# Patient Record
Sex: Female | Born: 1955 | Race: Black or African American | Marital: Single | Smoking: Former smoker
Health system: Southern US, Community
[De-identification: ages and names within clinical notes are randomized; demographics above are authoritative.]

## PROBLEM LIST (undated history)

## (undated) DIAGNOSIS — I10 Essential (primary) hypertension: Secondary | ICD-10-CM

## (undated) HISTORY — PX: OTHER SURGICAL HISTORY: SHX169

## (undated) HISTORY — PX: GALLBLADDER SURGERY: SHX652

## (undated) HISTORY — PX: CARPAL TUNNEL RELEASE: SHX101

---

## 2018-08-21 ENCOUNTER — Other Ambulatory Visit: Payer: Self-pay

## 2018-08-21 ENCOUNTER — Emergency Department (HOSPITAL_COMMUNITY)
Admission: EM | Admit: 2018-08-21 | Discharge: 2018-08-21 | Disposition: A | Discharge status: Home / Self Care | Payer: BLUE CROSS/BLUE SHIELD | Attending: Emergency Medicine | Admitting: Emergency Medicine

## 2018-08-21 ENCOUNTER — Emergency Department (HOSPITAL_COMMUNITY): Payer: BLUE CROSS/BLUE SHIELD

## 2018-08-21 ENCOUNTER — Encounter (HOSPITAL_COMMUNITY): Payer: Self-pay

## 2018-08-21 DIAGNOSIS — I10 Essential (primary) hypertension: Secondary | ICD-10-CM | POA: Insufficient documentation

## 2018-08-21 DIAGNOSIS — R112 Nausea with vomiting, unspecified: Secondary | ICD-10-CM | POA: Insufficient documentation

## 2018-08-21 DIAGNOSIS — R42 Dizziness and giddiness: Secondary | ICD-10-CM | POA: Diagnosis not present

## 2018-08-21 DIAGNOSIS — R0981 Nasal congestion: Secondary | ICD-10-CM | POA: Insufficient documentation

## 2018-08-21 DIAGNOSIS — R51 Headache: Secondary | ICD-10-CM | POA: Insufficient documentation

## 2018-08-21 HISTORY — DX: Essential (primary) hypertension: I10

## 2018-08-21 LAB — CBC WITH DIFFERENTIAL/PLATELET
Abs Immature Granulocytes: 0.05 10*3/uL (ref 0.00–0.07)
Basophils Absolute: 0 10*3/uL (ref 0.0–0.1)
Basophils Relative: 0 %
Eosinophils Absolute: 0 10*3/uL (ref 0.0–0.5)
Eosinophils Relative: 0 %
HCT: 37.2 % (ref 36.0–46.0)
Hemoglobin: 11.8 g/dL — ABNORMAL LOW (ref 12.0–15.0)
Immature Granulocytes: 1 %
Lymphocytes Relative: 8 %
Lymphs Abs: 0.8 10*3/uL (ref 0.7–4.0)
MCH: 28.9 pg (ref 26.0–34.0)
MCHC: 31.7 g/dL (ref 30.0–36.0)
MCV: 91 fL (ref 80.0–100.0)
Monocytes Absolute: 0.3 10*3/uL (ref 0.1–1.0)
Monocytes Relative: 3 %
Neutro Abs: 9.1 10*3/uL — ABNORMAL HIGH (ref 1.7–7.7)
Neutrophils Relative %: 88 %
Platelets: 265 10*3/uL (ref 150–400)
RBC: 4.09 MIL/uL (ref 3.87–5.11)
RDW: 13.8 % (ref 11.5–15.5)
WBC: 10.3 10*3/uL (ref 4.0–10.5)
nRBC: 0 % (ref 0.0–0.2)

## 2018-08-21 LAB — BASIC METABOLIC PANEL
Anion gap: 13 (ref 5–15)
BUN: 15 mg/dL (ref 8–23)
CO2: 23 mmol/L (ref 22–32)
Calcium: 9.4 mg/dL (ref 8.9–10.3)
Chloride: 103 mmol/L (ref 98–111)
Creatinine, Ser: 0.81 mg/dL (ref 0.44–1.00)
GFR calc Af Amer: >60 mL/min (ref >60)
GFR calc non Af Amer: >60 mL/min (ref >60)
Glucose, Bld: 123 mg/dL — ABNORMAL HIGH (ref 70–99)
Potassium: 4 mmol/L (ref 3.5–5.1)
Sodium: 139 mmol/L (ref 135–145)

## 2018-08-21 LAB — TROPONIN I: Troponin I: <0.03 ng/mL (ref <0.03)

## 2018-08-21 LAB — MAGNESIUM: Magnesium: 1.7 mg/dL (ref 1.7–2.4)

## 2018-08-21 MED ORDER — SODIUM CHLORIDE 0.9 % IV BOLUS
1000.0000 mL | Freq: Once | INTRAVENOUS | Status: AC
  Administered 2018-08-21: 1000 mL via INTRAVENOUS

## 2018-08-21 MED ORDER — FLUTICASONE PROPIONATE 50 MCG/ACT NA SUSP: 2.0000 | Freq: Every day | NASAL | 0 refills | Status: AC

## 2018-08-21 MED ORDER — MECLIZINE HCL 12.5 MG PO TABS: 12.5000 mg | ORAL_TABLET | Freq: Three times a day (TID) | ORAL | 0 refills | Status: DC | PRN

## 2018-08-21 MED ORDER — LORATADINE 10 MG PO TABS: 10.0000 mg | ORAL_TABLET | Freq: Every day | ORAL | 0 refills | Status: AC

## 2018-08-21 MED ORDER — MECLIZINE HCL 25 MG PO TABS
25.0000 mg | ORAL_TABLET | Freq: Once | ORAL | Status: AC
  Administered 2018-08-21: 25 mg via ORAL
  Filled 2018-08-21: qty 1

## 2018-08-21 MED ORDER — DEXAMETHASONE SODIUM PHOSPHATE 10 MG/ML IJ SOLN
10.0000 mg | Freq: Once | INTRAMUSCULAR | Status: AC
  Administered 2018-08-21: 10 mg via INTRAVENOUS
  Filled 2018-08-21: qty 1

## 2018-08-21 MED ORDER — ONDANSETRON 4 MG PO TBDP
4.0000 mg | ORAL_TABLET | Freq: Once | ORAL | Status: AC
  Administered 2018-08-21: 15:00:00 4 mg via ORAL
  Filled 2018-08-21: qty 1

## 2018-08-21 MED ORDER — LORAZEPAM 2 MG/ML IJ SOLN
1.0000 mg | Freq: Once | INTRAMUSCULAR | Status: AC
  Administered 2018-08-21: 1 mg via INTRAVENOUS
  Filled 2018-08-21: qty 1

## 2018-08-21 NOTE — ED Notes (Signed)
Attempted to ambulate pt. She could not even stand, reported dizziness upon sitting up and said she needed to lie back down.

## 2018-08-21 NOTE — ED Notes (Signed)
ED Provider at bedside. 

## 2018-08-21 NOTE — ED Notes (Signed)
Patient verbalizes understanding of discharge instructions. Opportunity for questioning and answers were provided. Armband removed by staff, pt discharged from ED.  

## 2018-08-21 NOTE — ED Triage Notes (Addendum)
Pt arrives with [facility] c/o nausea and vomitting that started around 12-1 am this morning.  Pt reports vomiting "lots of times" and vomited her chicken she had for dinner. Pt also reports feeling dizzy "like the room is spinning". Pt has hx of HTN but was taken off bp meds by her provider and encouraged exercise and healthy eating per EMS. Pt given Zofran 4 mg en route by EMS. EMS reports pt's\ highest BP en route was 232/128.

## 2018-08-21 NOTE — ED Notes (Signed)
Pt returned to room from MRI.

## 2018-08-21 NOTE — ED Notes (Signed)
Pt ambulated well and had stable gait; denies feeling dizzy and states she "feels better".

## 2018-08-21 NOTE — ED Notes (Signed)
NURSE NAVIGATOR  Spoke to pts daughter French Ana , added her phone # and her sisters phone # to pts emergency contacts, made daughter aware that her mom had a MRI and results are forthcoming

## 2018-08-21 NOTE — ED Provider Notes (Signed)
[facility] EMERGENCY DEPARTMENT Provider Note   CSN: [id number] Arrival date & time: 08/21/18  6967    History   Chief Complaint Chief Complaint  Patient presents with  . n/v  . Hypertension    HPI Selena Ford is a 63 y.o. female.     HPI 63 year old female with past medical history of hypertension here with headache.  The patient states that over the last 12 hours, she has had progressively worsening, positional dizziness.  She states she was in her usual state of health last night, when she experienced a sensation of the room was spinning.  She tried to close her eyes and go to bed, and was able to sleep.  However, upon awakening around 3 AM again, she experienced extreme dizziness.  She felt like she was moving.  She felt like the room is spinning.  She had associated nausea and vomiting.  No overt headache with it.  The symptoms have persisted whenever she moves or sits up.  She was unable to essentially move due to this dizziness.  She denies history of vertigo.  No tinnitus.  She does endorse some sinus pressure and congestion over the last several days.  Denies any history of inner ear problems.  No other medical complaints.  No fevers or chills.  No focal numbness or weakness.  Past Medical History:  Diagnosis Date  . Hypertension     There are no active problems to display for this patient.   Past Surgical History:  Procedure Laterality Date  . c-section    . CARPAL TUNNEL RELEASE    . GALLBLADDER SURGERY       OB History   No obstetric history on file.      Home Medications    Prior to Admission medications   Medication Sig Start Date End Date Taking? Authorizing Provider  fluticasone (FLONASE) 50 MCG/ACT nasal spray Place 2 sprays into both nostrils daily for 14 days. 08/21/18 09/04/18  Shaune Pollack, MD  loratadine (CLARITIN) 10 MG tablet Take 1 tablet (10 mg total) by mouth daily for 14 days. 08/21/18 09/04/18  Shaune Pollack, MD   meclizine (ANTIVERT) 12.5 MG tablet Take 1 tablet (12.5 mg total) by mouth 3 (three) times daily as needed for dizziness. 08/21/18   Shaune Pollack, MD    Family History History reviewed. No pertinent family history.  Social History Social History   Tobacco Use  . Smoking status: Former Games developer  . Smokeless tobacco: Never Used  . Tobacco comment: pt reports "smoking when she younger"  Substance Use Topics  . Alcohol use: Not Currently  . Drug use: Never     Allergies   Aspirin   Review of Systems Review of Systems  Constitutional: Positive for fatigue. Negative for chills and fever.  HENT: Negative for congestion and rhinorrhea.   Eyes: Negative for visual disturbance.  Respiratory: Negative for cough, shortness of breath and wheezing.   Cardiovascular: Negative for chest pain and leg swelling.  Gastrointestinal: Positive for nausea and vomiting. Negative for abdominal pain and diarrhea.  Genitourinary: Negative for dysuria and flank pain.  Musculoskeletal: Negative for neck pain and neck stiffness.  Skin: Negative for rash and wound.  Allergic/Immunologic: Negative for immunocompromised state.  Neurological: Positive for dizziness and weakness. Negative for syncope and headaches.  All other systems reviewed and are negative.    Physical Exam Updated Vital Signs BP (!) 182/84 (BP Location: Right Arm) Comment: Simultaneous filing. User may not have seen previous  data.  Pulse 64 Comment: Simultaneous filing. User may not have seen previous data.  Temp 98.6 F (37 C) (Oral)   Resp 15   Ht 5\' 1"  (1.549 m)   Wt 81.6 kg   SpO2 99% Comment: Simultaneous filing. User may not have seen previous data.  BMI 34.01 kg/m   Physical Exam Vitals signs and nursing note reviewed.  Constitutional:      General: She is not in acute distress.    Appearance: She is well-developed.  HENT:     Head: Normocephalic and atraumatic.     Comments: Moderate nasal congestion. TMs w/  serous effusions bilaterally. Eyes:     Conjunctiva/sclera: Conjunctivae normal.  Neck:     Musculoskeletal: Neck supple.  Cardiovascular:     Rate and Rhythm: Normal rate and regular rhythm.     Heart sounds: Normal heart sounds. No murmur. No friction rub.  Pulmonary:     Effort: Pulmonary effort is normal. No respiratory distress.     Breath sounds: Normal breath sounds. No wheezing or rales.  Abdominal:     General: There is no distension.     Palpations: Abdomen is soft.     Tenderness: There is no abdominal tenderness.  Skin:    General: Skin is warm.     Capillary Refill: Capillary refill takes less than 2 seconds.  Neurological:     Mental Status: She is alert and oriented to person, place, and time.     Motor: No abnormal muscle tone.     Neurological Exam:  Mental Status: Alert and oriented to person, place, and time. Attention and concentration normal. Speech clear. Recent memory is intact. Cranial Nerves: Visual fields grossly intact. EOMI and PERRLA. Left ward nystagmus noted with left gaze deviation. Facial sensation intact at forehead, maxillary cheek, and chin/mandible bilaterally. No facial asymmetry or weakness. Hearing grossly normal. Uvula is midline, and palate elevates symmetrically. Normal SCM and trapezius strength. Tongue midline without fasciculations. Motor: Muscle strength 5/5 in proximal and distal UE and LE bilaterally. No pronator drift. Muscle tone normal. Reflexes: 2+ and symmetrical in all four extremities.  Sensation: Intact to light touch in upper and lower extremities distally bilaterally.  Gait: Normal without ataxia. Coordination: Normal FTN bilaterally.   ED Treatments / Results  Labs (all labs ordered are listed, but only abnormal results are displayed) Labs Reviewed  CBC WITH DIFFERENTIAL/PLATELET - Abnormal; Notable for the following components:      Result Value   Hemoglobin 11.8 (*)    Neutro Abs 9.1 (*)    All other components  within normal limits  BASIC METABOLIC PANEL - Abnormal; Notable for the following components:   Glucose, Bld 123 (*)    All other components within normal limits  TROPONIN I  MAGNESIUM    EKG EKG Interpretation  Date/Time:  Wednesday Aug 21 2018 09:19:25 EDT Ventricular Rate:  72 PR Interval:    QRS Duration: 101 QT Interval:  483 QTC Calculation: 529 R Axis:   10 Text Interpretation:  Sinus rhythm Prominent P waves, nondiagnostic Probable left ventricular hypertrophy Prolonged QT interval No old tracing to compare Confirmed by Shaune PollackIsaacs, Dashanti Burr [phone]([id number]) on 08/21/2018 9:36:11 AM   Radiology Ct Head Wo Contrast  Result Date: 08/21/2018 CLINICAL DATA:  Dizziness. EXAM: CT HEAD WITHOUT CONTRAST TECHNIQUE: Contiguous axial images were obtained from the base of the skull through the vertex without intravenous contrast. COMPARISON:  None. FINDINGS: Brain: No evidence of acute infarction, hemorrhage, hydrocephalus, extra-axial collection or  mass lesion/mass effect. Vascular: No hyperdense vessel or unexpected calcification. Skull: Normal. Negative for fracture or focal lesion. Sinuses/Orbits: No acute finding. Other: None. IMPRESSION: Normal head CT. Electronically Signed   By: Lupita Raider M.D.   On: 08/21/2018 10:23   Mr Brain Wo Contrast  Result Date: 08/21/2018 CLINICAL DATA:  Focal neuro deficit greater than 6 hours EXAM: MRI HEAD WITHOUT CONTRAST TECHNIQUE: Multiplanar, multiecho pulse sequences of the brain and surrounding structures were obtained without intravenous contrast. COMPARISON:  08/21/2018 FINDINGS: Brain: Negative for acute infarct. Multiple small periventricular deep white matter hyperintensities bilaterally. Brainstem and cerebellum intact. Negative for hemorrhage or mass. Vascular: Normal arterial flow voids Skull and upper cervical spine: Negative Sinuses/Orbits: Negative Other: None IMPRESSION: No acute intracranial abnormality. Mild-to-moderate white matter changes most  likely due to chronic microvascular ischemia. Electronically Signed   By: Marlan Palau M.D.   On: 08/21/2018 11:48    Procedures Procedures (including critical care time)  Medications Ordered in ED Medications  LORazepam (ATIVAN) injection 1 mg (1 mg Intravenous Given 08/21/18 1010)  sodium chloride 0.9 % bolus 1,000 mL (0 mLs Intravenous Stopped 08/21/18 1237)  meclizine (ANTIVERT) tablet 25 mg (25 mg Oral Given 08/21/18 1238)  dexamethasone (DECADRON) injection 10 mg (10 mg Intravenous Given 08/21/18 1239)  ondansetron (ZOFRAN-ODT) disintegrating tablet 4 mg (4 mg Oral Given 08/21/18 1444)     Initial Impression / Assessment and Plan / ED Course  I have reviewed the triage vital signs and the nursing notes.  Pertinent labs & imaging results that were available during my care of the patient were reviewed by me and considered in my medical decision making (see chart for details).  Clinical Course as of Aug 21 1727  Wed Aug 21, 2018  1340 63 yo F here with acute vertigo, worse when moving head and looking left. I suspect this is 2/2 peripheral etiology, but given her age, MRI obtained and is fortunately neg (CT w/o bleed as well given her HTN). Labs are reassuring - mild anemia unlikely to cause sx, trop neg, lytes wnl. EKG non-ischemic. Ativan, meclizine given w/ improvement. I suspect her vertigo is 2/2 middle/inner ear effusions related to her allergic sinusitis - will start on flonase, anithistamines, dose of decadron given here.   [CI]    Clinical Course User Index [CI] Shaune Pollack, MD       Pt feeling better with meds as above. Ambulatory now w/o difficulty. She does continue to have some nausea with head movements but is improving. Instructed on Epley maneuvers.  Final Clinical Impressions(s) / ED Diagnoses   Final diagnoses:  Vertigo    ED Discharge Orders         Ordered    fluticasone (FLONASE) 50 MCG/ACT nasal spray  Daily     08/21/18 1409    meclizine  (ANTIVERT) 12.5 MG tablet  3 times daily PRN     08/21/18 1409    loratadine (CLARITIN) 10 MG tablet  Daily     08/21/18 1409           Shaune Pollack, MD 08/21/18 1729

## 2018-08-21 NOTE — ED Notes (Signed)
Patient transported to CT 

## 2018-08-21 NOTE — Discharge Instructions (Signed)
DO NOT DRIVE until you are symptom free for at least 24 hours  Follow-up with an ENT physician. If you do not have one, you can call the number above or request a referral from your PCP.  Take an over-the-counter antihistamine like Loratadine or Fexofenadine for your allergies, in addition to flonase or other steroid nasal spray. Take this DAILY for 10-14 days to help drainage.  Take the meclizine as needed for vertigo. I'd recommend taking it at least twice a day tomorrow then as needed.

## 2018-08-21 NOTE — ED Notes (Signed)
Spoke to pt's daughter, Malani Cresswell at [phone] and gave update on her mother; informed her that her mother was still in MRI and that I would call her with further updates.

## 2018-08-21 NOTE — ED Notes (Signed)
Patient transported to MRI 

## 2018-08-22 ENCOUNTER — Telehealth: Payer: Self-pay | Admitting: *Deleted

## 2018-08-22 NOTE — Telephone Encounter (Signed)
[facility] consulted regarding PCP for parent.  [facility] referred [facility] as they are known to accept [employer]BS.  EDCM advised that if  [facility] is not accepting new patients, to call number on insurance card for list of physicians accepting patients at this time.  N o further EDCM needs identified.

## 2019-04-29 DIAGNOSIS — I10 Essential (primary) hypertension: Secondary | ICD-10-CM | POA: Diagnosis not present

## 2019-04-29 DIAGNOSIS — E213 Hyperparathyroidism, unspecified: Secondary | ICD-10-CM | POA: Diagnosis not present

## 2019-04-29 DIAGNOSIS — D649 Anemia, unspecified: Secondary | ICD-10-CM | POA: Diagnosis not present

## 2019-07-05 ENCOUNTER — Ambulatory Visit: Payer: Medicare Other | Attending: Internal Medicine

## 2019-07-05 DIAGNOSIS — Z23 Encounter for immunization: Secondary | ICD-10-CM

## 2019-07-05 NOTE — Progress Notes (Signed)
   Covid-19 Vaccination Clinic  Name:  Selena Ford    MRN: [id number] DOB: 03/04/1956  07/05/2019  [name] was observed post Covid-19 immunization for 30 minutes based on pre-vaccination screening without incident. She was provided with Vaccine Information Sheet and instruction to access the V-Safe system.   [name] was instructed to call 911 with any severe reactions post vaccine: Marland Kitchen Difficulty breathing  . Swelling of face and throat  . A fast heartbeat  . A bad rash all over body  . Dizziness and weakness   Immunizations Administered    Name Date Dose VIS Date Route   Pfizer COVID-19 Vaccine 07/05/2019 10:15 AM 0.3 mL 03/07/2019 Intramuscular   Manufacturer: [employer], [employer]   Lot: [id number]   NDC: [address]-6

## 2019-07-28 ENCOUNTER — Ambulatory Visit: Payer: Medicare Other | Attending: Internal Medicine

## 2019-07-28 DIAGNOSIS — Z23 Encounter for immunization: Secondary | ICD-10-CM

## 2019-07-28 NOTE — Progress Notes (Signed)
   Covid-19 Vaccination Clinic  Name:  Selena Ford    MRN: [id number] DOB: [birth year]  07/28/2019  [name] was observed post Covid-19 immunization for 15 minutes without incident. She was provided with Vaccine Information Sheet and instruction to access the V-Safe system.   [name] was instructed to call 911 with any severe reactions post vaccine: Marland Kitchen Difficulty breathing  . Swelling of face and throat  . A fast heartbeat  . A bad rash all over body  . Dizziness and weakness   Immunizations Administered    Name Date Dose VIS Date Route   Pfizer COVID-19 Vaccine 07/28/2019 12:57 PM 0.3 mL 05/21/2018 Intramuscular   Manufacturer: [employer], [employer]   Lot: [id number]   NDC: [address]-6

## 2019-07-29 DIAGNOSIS — Z1339 Encounter for screening examination for other mental health and behavioral disorders: Secondary | ICD-10-CM | POA: Diagnosis not present

## 2019-07-29 DIAGNOSIS — G4733 Obstructive sleep apnea (adult) (pediatric): Secondary | ICD-10-CM | POA: Diagnosis not present

## 2019-07-29 DIAGNOSIS — E559 Vitamin D deficiency, unspecified: Secondary | ICD-10-CM | POA: Diagnosis not present

## 2019-07-29 DIAGNOSIS — M25562 Pain in left knee: Secondary | ICD-10-CM | POA: Diagnosis not present

## 2019-07-29 DIAGNOSIS — Z1159 Encounter for screening for other viral diseases: Secondary | ICD-10-CM | POA: Diagnosis not present

## 2019-07-29 DIAGNOSIS — Z03818 Encounter for observation for suspected exposure to other biological agents ruled out: Secondary | ICD-10-CM | POA: Diagnosis not present

## 2019-07-29 DIAGNOSIS — Z0000 Encounter for general adult medical examination without abnormal findings: Secondary | ICD-10-CM | POA: Diagnosis not present

## 2019-07-29 DIAGNOSIS — N183 Chronic kidney disease, stage 3 unspecified: Secondary | ICD-10-CM | POA: Diagnosis not present

## 2019-07-29 DIAGNOSIS — I10 Essential (primary) hypertension: Secondary | ICD-10-CM | POA: Diagnosis not present

## 2019-08-06 DIAGNOSIS — Z1211 Encounter for screening for malignant neoplasm of colon: Secondary | ICD-10-CM | POA: Diagnosis not present

## 2019-08-06 DIAGNOSIS — Z1212 Encounter for screening for malignant neoplasm of rectum: Secondary | ICD-10-CM | POA: Diagnosis not present

## 2019-08-12 DIAGNOSIS — E213 Hyperparathyroidism, unspecified: Secondary | ICD-10-CM | POA: Diagnosis not present

## 2019-08-12 DIAGNOSIS — D649 Anemia, unspecified: Secondary | ICD-10-CM | POA: Diagnosis not present

## 2019-08-18 ENCOUNTER — Other Ambulatory Visit: Payer: Self-pay | Admitting: Adult Medicine

## 2019-08-18 DIAGNOSIS — R5381 Other malaise: Secondary | ICD-10-CM

## 2019-08-18 DIAGNOSIS — Z1231 Encounter for screening mammogram for malignant neoplasm of breast: Secondary | ICD-10-CM

## 2019-10-23 DIAGNOSIS — Z20822 Contact with and (suspected) exposure to covid-19: Secondary | ICD-10-CM | POA: Diagnosis not present

## 2019-11-17 ENCOUNTER — Other Ambulatory Visit: Payer: Self-pay | Admitting: Adult Medicine

## 2019-11-17 DIAGNOSIS — Z13820 Encounter for screening for osteoporosis: Secondary | ICD-10-CM

## 2019-11-18 ENCOUNTER — Ambulatory Visit
Admission: RE | Admit: 2019-11-18 | Discharge: 2019-11-18 | Disposition: A | Discharge status: Home / Self Care | Payer: Medicare Other | Source: Ambulatory Visit | Attending: Adult Medicine | Admitting: Adult Medicine

## 2019-11-18 ENCOUNTER — Other Ambulatory Visit: Payer: Self-pay | Admitting: Adult Medicine

## 2019-11-18 ENCOUNTER — Other Ambulatory Visit: Payer: Self-pay

## 2019-11-18 DIAGNOSIS — Z13820 Encounter for screening for osteoporosis: Secondary | ICD-10-CM

## 2019-11-18 DIAGNOSIS — Z1231 Encounter for screening mammogram for malignant neoplasm of breast: Secondary | ICD-10-CM

## 2019-11-18 DIAGNOSIS — E785 Hyperlipidemia, unspecified: Secondary | ICD-10-CM | POA: Diagnosis not present

## 2019-11-18 DIAGNOSIS — Z1159 Encounter for screening for other viral diseases: Secondary | ICD-10-CM | POA: Diagnosis not present

## 2019-11-18 DIAGNOSIS — E2839 Other primary ovarian failure: Secondary | ICD-10-CM

## 2019-11-18 DIAGNOSIS — M85852 Other specified disorders of bone density and structure, left thigh: Secondary | ICD-10-CM | POA: Diagnosis not present

## 2019-11-18 DIAGNOSIS — Z780 Asymptomatic menopausal state: Secondary | ICD-10-CM | POA: Diagnosis not present

## 2019-11-18 DIAGNOSIS — N183 Chronic kidney disease, stage 3 unspecified: Secondary | ICD-10-CM | POA: Diagnosis not present

## 2019-11-18 DIAGNOSIS — E559 Vitamin D deficiency, unspecified: Secondary | ICD-10-CM | POA: Diagnosis not present

## 2019-11-25 ENCOUNTER — Other Ambulatory Visit: Payer: Self-pay | Admitting: Adult Medicine

## 2019-11-25 DIAGNOSIS — R928 Other abnormal and inconclusive findings on diagnostic imaging of breast: Secondary | ICD-10-CM

## 2019-12-05 ENCOUNTER — Ambulatory Visit
Admission: RE | Admit: 2019-12-05 | Discharge: 2019-12-05 | Disposition: A | Discharge status: Home / Self Care | Payer: Medicare Other | Source: Ambulatory Visit | Attending: Adult Medicine | Admitting: Adult Medicine

## 2019-12-05 ENCOUNTER — Other Ambulatory Visit: Payer: Self-pay

## 2019-12-05 ENCOUNTER — Ambulatory Visit: Payer: Medicare Other

## 2019-12-05 ENCOUNTER — Other Ambulatory Visit: Payer: Medicare Other

## 2019-12-05 DIAGNOSIS — R928 Other abnormal and inconclusive findings on diagnostic imaging of breast: Secondary | ICD-10-CM | POA: Diagnosis not present

## 2019-12-05 DIAGNOSIS — N6002 Solitary cyst of left breast: Secondary | ICD-10-CM | POA: Diagnosis not present

## 2020-02-07 DIAGNOSIS — N183 Chronic kidney disease, stage 3 unspecified: Secondary | ICD-10-CM | POA: Diagnosis not present

## 2020-02-07 DIAGNOSIS — R7303 Prediabetes: Secondary | ICD-10-CM | POA: Diagnosis not present

## 2020-04-06 DIAGNOSIS — E213 Hyperparathyroidism, unspecified: Secondary | ICD-10-CM | POA: Diagnosis not present

## 2020-04-06 DIAGNOSIS — I10 Essential (primary) hypertension: Secondary | ICD-10-CM | POA: Diagnosis not present

## 2020-04-23 DIAGNOSIS — Z1152 Encounter for screening for COVID-19: Secondary | ICD-10-CM | POA: Diagnosis not present

## 2020-04-26 DIAGNOSIS — U071 COVID-19: Secondary | ICD-10-CM | POA: Diagnosis not present

## 2020-05-17 DIAGNOSIS — E041 Nontoxic single thyroid nodule: Secondary | ICD-10-CM | POA: Diagnosis not present

## 2020-05-17 DIAGNOSIS — E213 Hyperparathyroidism, unspecified: Secondary | ICD-10-CM | POA: Diagnosis not present

## 2020-05-17 DIAGNOSIS — I10 Essential (primary) hypertension: Secondary | ICD-10-CM | POA: Diagnosis not present

## 2020-05-17 DIAGNOSIS — N183 Chronic kidney disease, stage 3 unspecified: Secondary | ICD-10-CM | POA: Diagnosis not present

## 2020-05-17 DIAGNOSIS — Z87898 Personal history of other specified conditions: Secondary | ICD-10-CM | POA: Diagnosis not present

## 2020-07-15 DIAGNOSIS — Z760 Encounter for issue of repeat prescription: Secondary | ICD-10-CM | POA: Diagnosis not present

## 2020-07-15 DIAGNOSIS — Z789 Other specified health status: Secondary | ICD-10-CM | POA: Diagnosis not present

## 2020-07-15 DIAGNOSIS — E213 Hyperparathyroidism, unspecified: Secondary | ICD-10-CM | POA: Diagnosis not present

## 2020-07-15 DIAGNOSIS — E559 Vitamin D deficiency, unspecified: Secondary | ICD-10-CM | POA: Diagnosis not present

## 2020-07-16 DIAGNOSIS — E213 Hyperparathyroidism, unspecified: Secondary | ICD-10-CM | POA: Diagnosis not present

## 2020-08-11 DIAGNOSIS — E213 Hyperparathyroidism, unspecified: Secondary | ICD-10-CM | POA: Diagnosis not present

## 2020-08-11 DIAGNOSIS — E041 Nontoxic single thyroid nodule: Secondary | ICD-10-CM | POA: Diagnosis not present

## 2020-08-11 DIAGNOSIS — I10 Essential (primary) hypertension: Secondary | ICD-10-CM | POA: Diagnosis not present

## 2020-08-19 DIAGNOSIS — E213 Hyperparathyroidism, unspecified: Secondary | ICD-10-CM | POA: Diagnosis not present

## 2020-08-31 DIAGNOSIS — Z789 Other specified health status: Secondary | ICD-10-CM | POA: Diagnosis not present

## 2020-08-31 DIAGNOSIS — E213 Hyperparathyroidism, unspecified: Secondary | ICD-10-CM | POA: Diagnosis not present

## 2020-08-31 DIAGNOSIS — I10 Essential (primary) hypertension: Secondary | ICD-10-CM | POA: Diagnosis not present

## 2020-10-13 DIAGNOSIS — E213 Hyperparathyroidism, unspecified: Secondary | ICD-10-CM | POA: Diagnosis not present

## 2020-10-20 DIAGNOSIS — Z0000 Encounter for general adult medical examination without abnormal findings: Secondary | ICD-10-CM | POA: Diagnosis not present

## 2020-11-01 DIAGNOSIS — E213 Hyperparathyroidism, unspecified: Secondary | ICD-10-CM | POA: Diagnosis not present

## 2020-11-01 DIAGNOSIS — E559 Vitamin D deficiency, unspecified: Secondary | ICD-10-CM | POA: Diagnosis not present

## 2020-11-10 DIAGNOSIS — E213 Hyperparathyroidism, unspecified: Secondary | ICD-10-CM | POA: Diagnosis not present

## 2020-11-10 DIAGNOSIS — I10 Essential (primary) hypertension: Secondary | ICD-10-CM | POA: Diagnosis not present

## 2020-11-10 DIAGNOSIS — E559 Vitamin D deficiency, unspecified: Secondary | ICD-10-CM | POA: Diagnosis not present

## 2020-11-15 DIAGNOSIS — E041 Nontoxic single thyroid nodule: Secondary | ICD-10-CM | POA: Diagnosis not present

## 2020-11-15 DIAGNOSIS — R7303 Prediabetes: Secondary | ICD-10-CM | POA: Diagnosis not present

## 2020-11-15 DIAGNOSIS — Z0000 Encounter for general adult medical examination without abnormal findings: Secondary | ICD-10-CM | POA: Diagnosis not present

## 2020-11-15 DIAGNOSIS — E213 Hyperparathyroidism, unspecified: Secondary | ICD-10-CM | POA: Diagnosis not present

## 2020-11-15 DIAGNOSIS — Z1159 Encounter for screening for other viral diseases: Secondary | ICD-10-CM | POA: Diagnosis not present

## 2020-11-15 DIAGNOSIS — N183 Chronic kidney disease, stage 3 unspecified: Secondary | ICD-10-CM | POA: Diagnosis not present

## 2020-11-17 ENCOUNTER — Other Ambulatory Visit: Payer: Self-pay | Admitting: Adult Medicine

## 2020-11-17 DIAGNOSIS — Z1231 Encounter for screening mammogram for malignant neoplasm of breast: Secondary | ICD-10-CM

## 2020-11-22 DIAGNOSIS — E785 Hyperlipidemia, unspecified: Secondary | ICD-10-CM | POA: Diagnosis not present

## 2020-11-22 DIAGNOSIS — I10 Essential (primary) hypertension: Secondary | ICD-10-CM | POA: Diagnosis not present

## 2020-12-15 ENCOUNTER — Ambulatory Visit
Admission: RE | Admit: 2020-12-15 | Discharge: 2020-12-15 | Disposition: A | Discharge status: Home / Self Care | Payer: Medicare (Managed Care) | Source: Ambulatory Visit | Attending: Adult Medicine | Admitting: Adult Medicine

## 2020-12-15 ENCOUNTER — Other Ambulatory Visit: Payer: Self-pay

## 2020-12-15 DIAGNOSIS — Z1231 Encounter for screening mammogram for malignant neoplasm of breast: Secondary | ICD-10-CM | POA: Diagnosis not present

## 2020-12-16 ENCOUNTER — Emergency Department (HOSPITAL_COMMUNITY): Payer: Medicare (Managed Care)

## 2020-12-16 ENCOUNTER — Other Ambulatory Visit: Payer: Self-pay

## 2020-12-16 ENCOUNTER — Encounter (HOSPITAL_COMMUNITY): Payer: Self-pay | Admitting: *Deleted

## 2020-12-16 ENCOUNTER — Emergency Department (HOSPITAL_COMMUNITY)
Admission: EM | Admit: 2020-12-16 | Discharge: 2020-12-16 | Disposition: A | Discharge status: Home / Self Care | Payer: Medicare (Managed Care) | Attending: Emergency Medicine | Admitting: Emergency Medicine

## 2020-12-16 DIAGNOSIS — I10 Essential (primary) hypertension: Secondary | ICD-10-CM | POA: Diagnosis not present

## 2020-12-16 DIAGNOSIS — R110 Nausea: Secondary | ICD-10-CM | POA: Diagnosis not present

## 2020-12-16 DIAGNOSIS — G9389 Other specified disorders of brain: Secondary | ICD-10-CM | POA: Diagnosis not present

## 2020-12-16 DIAGNOSIS — R42 Dizziness and giddiness: Secondary | ICD-10-CM | POA: Diagnosis not present

## 2020-12-16 DIAGNOSIS — Z87891 Personal history of nicotine dependence: Secondary | ICD-10-CM | POA: Insufficient documentation

## 2020-12-16 DIAGNOSIS — R1111 Vomiting without nausea: Secondary | ICD-10-CM | POA: Diagnosis not present

## 2020-12-16 DIAGNOSIS — R1084 Generalized abdominal pain: Secondary | ICD-10-CM | POA: Diagnosis not present

## 2020-12-16 DIAGNOSIS — H814 Vertigo of central origin: Secondary | ICD-10-CM | POA: Diagnosis not present

## 2020-12-16 DIAGNOSIS — R9431 Abnormal electrocardiogram [ECG] [EKG]: Secondary | ICD-10-CM | POA: Diagnosis not present

## 2020-12-16 LAB — CBC WITH DIFFERENTIAL/PLATELET
Abs Immature Granulocytes: 0.07 10*3/uL (ref 0.00–0.07)
Basophils Absolute: 0.1 10*3/uL (ref 0.0–0.1)
Basophils Relative: 0 %
Eosinophils Absolute: 0.2 10*3/uL (ref 0.0–0.5)
Eosinophils Relative: 1 %
HCT: 33 % — ABNORMAL LOW (ref 36.0–46.0)
Hemoglobin: 10.2 g/dL — ABNORMAL LOW (ref 12.0–15.0)
Immature Granulocytes: 1 %
Lymphocytes Relative: 28 %
Lymphs Abs: 3.9 10*3/uL (ref 0.7–4.0)
MCH: 28.1 pg (ref 26.0–34.0)
MCHC: 30.9 g/dL (ref 30.0–36.0)
MCV: 90.9 fL (ref 80.0–100.0)
Monocytes Absolute: 1 10*3/uL (ref 0.1–1.0)
Monocytes Relative: 7 %
Neutro Abs: 9 10*3/uL — ABNORMAL HIGH (ref 1.7–7.7)
Neutrophils Relative %: 63 %
Platelets: 248 10*3/uL (ref 150–400)
RBC: 3.63 MIL/uL — ABNORMAL LOW (ref 3.87–5.11)
RDW: 14.6 % (ref 11.5–15.5)
WBC: 14.2 10*3/uL — ABNORMAL HIGH (ref 4.0–10.5)
nRBC: 0 % (ref 0.0–0.2)

## 2020-12-16 LAB — BASIC METABOLIC PANEL
Anion gap: 10 (ref 5–15)
BUN: 27 mg/dL — ABNORMAL HIGH (ref 8–23)
CO2: 24 mmol/L (ref 22–32)
Calcium: 9.5 mg/dL (ref 8.9–10.3)
Chloride: 104 mmol/L (ref 98–111)
Creatinine, Ser: 0.98 mg/dL (ref 0.44–1.00)
GFR, Estimated: >60 mL/min (ref >60)
Glucose, Bld: 141 mg/dL — ABNORMAL HIGH (ref 70–99)
Potassium: 3.5 mmol/L (ref 3.5–5.1)
Sodium: 138 mmol/L (ref 135–145)

## 2020-12-16 LAB — CBG MONITORING, ED: Glucose-Capillary: 151 mg/dL — ABNORMAL HIGH (ref 70–99)

## 2020-12-16 LAB — TROPONIN I (HIGH SENSITIVITY)
Troponin I (High Sensitivity): 8 ng/L (ref <18)
Troponin I (High Sensitivity): 8 ng/L (ref <18)

## 2020-12-16 MED ORDER — MECLIZINE HCL 25 MG PO TABS
25.0000 mg | ORAL_TABLET | Freq: Once | ORAL | Status: AC
  Administered 2020-12-16: 25 mg via ORAL
  Filled 2020-12-16 (×3): qty 1

## 2020-12-16 MED ORDER — LORAZEPAM 2 MG/ML IJ SOLN
1.0000 mg | Freq: Once | INTRAMUSCULAR | Status: AC
  Administered 2020-12-16: 1 mg via INTRAVENOUS
  Filled 2020-12-16: qty 1

## 2020-12-16 MED ORDER — ONDANSETRON HCL 4 MG/2ML IJ SOLN
4.0000 mg | Freq: Once | INTRAMUSCULAR | Status: AC
  Administered 2020-12-16: 4 mg via INTRAVENOUS
  Filled 2020-12-16: qty 2

## 2020-12-16 MED ORDER — MECLIZINE HCL 25 MG PO TABS: 25.0000 mg | ORAL_TABLET | Freq: Three times a day (TID) | ORAL | 0 refills | Status: AC | PRN

## 2020-12-16 NOTE — ED Notes (Signed)
PT states she feels she is now able to lay flat.  CT notified.

## 2020-12-16 NOTE — ED Notes (Signed)
Patient transported to MRI 

## 2020-12-16 NOTE — ED Triage Notes (Signed)
Pt states acute onset dizziness and frontal headache and vomiting at 12:30.  Dizziness/nausea changes with movement.  Hx of vertigo.  Vitals: 234/112 60 hr 117 cbg 99% 16 rr

## 2020-12-16 NOTE — ED Provider Notes (Signed)
Emergency Medicine Provider Triage Evaluation Note  Selena Ford , a 65 y.o. female  was evaluated in triage.  Pt complains of headache and dizziness.  Onset earlier today around lunch.  Initially worse with movement.  Has hx of vertigo, but is having significant headache and vomiting today as well.  Review of Systems  Positive: Dizziness, headache, vomiting Negative: Fever, chills  Physical Exam  BP (!) 196/93 (BP Location: Left Arm)   Pulse (!) 53   Temp (!) 97.4 F (36.3 C) (Oral)   Resp 12   Ht 5\' 1"  (1.549 m)   Wt 81.6 kg   SpO2 94%   BMI 34.01 kg/m  Gen:   Awake, no distress   Resp:  Normal effort  MSK:   Moves extremities without difficulty  Other:  Dry heaving  Medical Decision Making  Medically screening exam initiated at 1:19 AM.  Appropriate orders placed.  Selena Ford was informed that the remainder of the evaluation will be completed by another provider, this initial triage assessment does not replace that evaluation, and the importance of remaining in the ED until their evaluation is complete.  dizziness   Berton Lan, PA-C 12/16/20 0120    12/18/20, MD 12/17/20 [phone]

## 2020-12-16 NOTE — ED Notes (Signed)
The pt has had dizziness vertigo in the past.  She took med for awhile but the doctor took her off the medicine.  Earlier this am the dizziness and nausea started  pt asleep difficult to awaken  daughter at  the bedside

## 2020-12-16 NOTE — ED Provider Notes (Signed)
[facility] EMERGENCY DEPARTMENT Provider Note   CSN: [id number] Arrival date & time: 12/16/20  0113     History Chief Complaint  Patient presents with   Dizziness    Selena Ford is a 65 y.o. female.  Patient presents to the emergency department for evaluation of dizziness.  Patient reports symptoms of spinning that occurred earlier today and have been persistent.  She has had nausea associated with the dizziness.  She cannot sleep because symptoms seemed worse while lying down.  She does have a previous history of vertigo.      Past Medical History:  Diagnosis Date   Hypertension     There are no problems to display for this patient.   Past Surgical History:  Procedure Laterality Date   c-section     CARPAL TUNNEL RELEASE     GALLBLADDER SURGERY       OB History   No obstetric history on file.     No family history on file.  Social History   Tobacco Use   Smoking status: Former   Smokeless tobacco: Never   Tobacco comments:    pt reports "smoking when she younger"  Vaping Use   Vaping Use: Never used  Substance Use Topics   Alcohol use: Not Currently   Drug use: Never    Home Medications Prior to Admission medications   Medication Sig Start Date End Date Taking? Authorizing Provider  fluticasone (FLONASE) 50 MCG/ACT nasal spray Place 2 sprays into both nostrils daily for 14 days. 08/21/18 09/04/18  Shaune Pollack, MD  loratadine (CLARITIN) 10 MG tablet Take 1 tablet (10 mg total) by mouth daily for 14 days. 08/21/18 09/04/18  Shaune Pollack, MD  meclizine (ANTIVERT) 12.5 MG tablet Take 1 tablet (12.5 mg total) by mouth 3 (three) times daily as needed for dizziness. 08/21/18   Shaune Pollack, MD    Allergies    Aspirin  Review of Systems   Review of Systems  Gastrointestinal:  Positive for nausea.  Neurological:  Positive for dizziness.  All other systems reviewed and are negative.  Physical Exam Updated Vital Signs BP (!)  162/77   Pulse (!) 55   Temp (!) 97.4 F (36.3 C) (Oral)   Resp 13   Ht 5\' 1"  (1.549 m)   Wt 81.6 kg   SpO2 99%   BMI 34.01 kg/m   Physical Exam Vitals and nursing note reviewed.  Constitutional:      General: She is not in acute distress.    Appearance: Normal appearance. She is well-developed.  HENT:     Head: Normocephalic and atraumatic.     Right Ear: Hearing normal.     Left Ear: Hearing normal.     Nose: Nose normal.  Eyes:     Conjunctiva/sclera: Conjunctivae normal.     Pupils: Pupils are equal, round, and reactive to light.  Cardiovascular:     Rate and Rhythm: Regular rhythm.     Heart sounds: S1 normal and S2 normal. No murmur heard.   No friction rub. No gallop.  Pulmonary:     Effort: Pulmonary effort is normal. No respiratory distress.     Breath sounds: Normal breath sounds.  Chest:     Chest wall: No tenderness.  Abdominal:     General: Bowel sounds are normal.     Palpations: Abdomen is soft.     Tenderness: There is no abdominal tenderness. There is no guarding or rebound. Negative signs include Murphy's sign  and McBurney's sign.     Hernia: No hernia is present.  Musculoskeletal:        General: Normal range of motion.     Cervical back: Normal range of motion and neck supple.  Skin:    General: Skin is warm and dry.     Findings: No rash.  Neurological:     Mental Status: She is alert and oriented to person, place, and time.     GCS: GCS eye subscore is 4. GCS verbal subscore is 5. GCS motor subscore is 6.     Cranial Nerves: No cranial nerve deficit.     Sensory: No sensory deficit.     Coordination: Coordination normal.  Psychiatric:        Speech: Speech normal.        Behavior: Behavior normal.        Thought Content: Thought content normal.    ED Results / Procedures / Treatments   Labs (all labs ordered are listed, but only abnormal results are displayed) Labs Reviewed  CBC WITH DIFFERENTIAL/PLATELET - Abnormal; Notable for the  following components:      Result Value   WBC 14.2 (*)    RBC 3.63 (*)    Hemoglobin 10.2 (*)    HCT 33.0 (*)    Neutro Abs 9.0 (*)    All other components within normal limits  BASIC METABOLIC PANEL - Abnormal; Notable for the following components:   Glucose, Bld 141 (*)    BUN 27 (*)    All other components within normal limits  CBG MONITORING, ED - Abnormal; Notable for the following components:   Glucose-Capillary 151 (*)    All other components within normal limits  TROPONIN I (HIGH SENSITIVITY)  TROPONIN I (HIGH SENSITIVITY)    EKG None  Radiology CT HEAD WO CONTRAST ( )  Result Date: 12/16/2020 CLINICAL DATA:  Dizziness. EXAM: CT HEAD WITHOUT CONTRAST TECHNIQUE: Contiguous axial images were obtained from the base of the skull through the vertex without intravenous contrast. COMPARISON:  Head CT dated 08/21/2018. FINDINGS: Brain: The ventricles and sulci appropriate size for patient's age. The gray-white matter discrimination is preserved. There is no acute intracranial hemorrhage. No mass effect or midline shift. No extra-axial fluid collection. Vascular: No hyperdense vessel or unexpected calcification. Skull: Normal. Negative for fracture or focal lesion. Sinuses/Orbits: No acute finding. Other: None IMPRESSION: Unremarkable noncontrast CT of the brain. Electronically Signed   By: Elgie Collard M.D.   On: 12/16/2020 02:45    Procedures Procedures   Medications Ordered in ED Medications  meclizine (ANTIVERT) tablet 25 mg (25 mg Oral Given 12/16/20 0626)  LORazepam (ATIVAN) injection 1 mg (1 mg Intravenous Given 12/16/20 0208)  ondansetron (ZOFRAN) injection 4 mg (4 mg Intravenous Given 12/16/20 0208)    ED Course  I have reviewed the triage vital signs and the nursing notes.  Pertinent labs & imaging results that were available during my care of the patient were reviewed by me and considered in my medical decision making (see chart for details).    MDM  Rules/Calculators/A&P                           Patient with positional dizziness.  Symptoms are improving here in the emergency department.  Nonfocal neurologic exam.  Initial work-up negative.  Pending MRI.  Will sign out to oncoming ER physician, anticipate discharge if normal. Final Clinical Impression(s) / ED Diagnoses Final diagnoses:  Vertigo    Rx / DC Orders ED Discharge Orders     None        Reinhardt Licausi, Canary Brim, MD 12/16/20 [phone]

## 2020-12-16 NOTE — Discharge Instructions (Addendum)
You have been seen and discharged from the emergency department.  Your blood work was normal.  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  Have this followed up by your primary doctor and ears nose and throat doctore, this will most likely will require ultrasound. Referral information for ENT is included in your paperwork. Take home medications as prescribed. If you have any worsening symptoms or further concerns for your health please return to an emergency department for further evaluation.

## 2020-12-16 NOTE — ED Provider Notes (Signed)
Patient signed out to me by previous provider. Please refer to their note for full HPI.  Briefly this is a 65 year old female who present to the emergency department positional dizziness, high suspicion for vertigo.  Patient signed out to me pending MRI imaging and reevaluation. Physical Exam  BP (!) 170/91   Pulse 65   Temp 98.7 F (37.1 C) (Oral)   Resp 12   Ht 5\' 1"  (1.549 m)   Wt 81.6 kg   SpO2 98%   BMI 34.01 kg/m   Physical Exam Vitals and nursing note reviewed.  Constitutional:      Appearance: Normal appearance.  HENT:     Head: Normocephalic.     Mouth/Throat:     Mouth: Mucous membranes are moist.  Cardiovascular:     Rate and Rhythm: Normal rate.  Pulmonary:     Effort: Pulmonary effort is normal. No respiratory distress.  Skin:    General: Skin is warm.  Neurological:     Mental Status: She is alert and oriented to person, place, and time. Mental status is at baseline.     Cranial Nerves: No cranial nerve deficit.     Motor: No weakness.  Psychiatric:        Mood and Affect: Mood normal.    ED Course/Procedures     Procedures  MDM   Lab work and MRI are unremarkable.  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  This was discussed with the patient.  Patient states her symptoms has resolved, she is ambulated to the bathroom without difficulty.  Patient at this time appears safe and stable for discharge and will be treated as an outpatient.  Discharge plan and strict return to ED precautions discussed, patient verbalizes understanding and agreement.       , DO 12/16/20 1002

## 2020-12-21 DIAGNOSIS — I10 Essential (primary) hypertension: Secondary | ICD-10-CM | POA: Diagnosis not present

## 2020-12-21 DIAGNOSIS — Z789 Other specified health status: Secondary | ICD-10-CM | POA: Diagnosis not present

## 2020-12-22 ENCOUNTER — Other Ambulatory Visit: Payer: Self-pay | Admitting: Adult Medicine

## 2020-12-22 DIAGNOSIS — R928 Other abnormal and inconclusive findings on diagnostic imaging of breast: Secondary | ICD-10-CM

## 2021-01-10 ENCOUNTER — Ambulatory Visit
Admission: RE | Admit: 2021-01-10 | Discharge: 2021-01-10 | Disposition: A | Discharge status: Home / Self Care | Payer: Medicare (Managed Care) | Source: Ambulatory Visit | Attending: Adult Medicine | Admitting: Adult Medicine

## 2021-01-10 ENCOUNTER — Other Ambulatory Visit: Payer: Self-pay

## 2021-01-10 ENCOUNTER — Other Ambulatory Visit: Payer: Self-pay | Admitting: Adult Medicine

## 2021-01-10 DIAGNOSIS — N6489 Other specified disorders of breast: Secondary | ICD-10-CM | POA: Diagnosis not present

## 2021-01-10 DIAGNOSIS — R928 Other abnormal and inconclusive findings on diagnostic imaging of breast: Secondary | ICD-10-CM

## 2021-01-10 DIAGNOSIS — R922 Inconclusive mammogram: Secondary | ICD-10-CM | POA: Diagnosis not present

## 2021-01-18 ENCOUNTER — Encounter (HOSPITAL_COMMUNITY): Payer: Self-pay | Admitting: Radiology

## 2021-02-21 DIAGNOSIS — I10 Essential (primary) hypertension: Secondary | ICD-10-CM | POA: Diagnosis not present

## 2021-02-21 DIAGNOSIS — R7303 Prediabetes: Secondary | ICD-10-CM | POA: Diagnosis not present

## 2021-02-21 DIAGNOSIS — E785 Hyperlipidemia, unspecified: Secondary | ICD-10-CM | POA: Diagnosis not present

## 2021-02-21 DIAGNOSIS — N183 Chronic kidney disease, stage 3 unspecified: Secondary | ICD-10-CM | POA: Diagnosis not present

## 2021-05-13 DIAGNOSIS — K118 Other diseases of salivary glands: Secondary | ICD-10-CM | POA: Diagnosis not present

## 2021-05-13 DIAGNOSIS — R42 Dizziness and giddiness: Secondary | ICD-10-CM | POA: Diagnosis not present

## 2021-05-13 DIAGNOSIS — H903 Sensorineural hearing loss, bilateral: Secondary | ICD-10-CM | POA: Diagnosis not present

## 2021-05-24 DIAGNOSIS — E559 Vitamin D deficiency, unspecified: Secondary | ICD-10-CM | POA: Diagnosis not present

## 2021-05-24 DIAGNOSIS — I10 Essential (primary) hypertension: Secondary | ICD-10-CM | POA: Diagnosis not present

## 2021-07-05 ENCOUNTER — Other Ambulatory Visit: Payer: Self-pay | Admitting: Adult Medicine

## 2021-07-05 DIAGNOSIS — R928 Other abnormal and inconclusive findings on diagnostic imaging of breast: Secondary | ICD-10-CM

## 2021-07-12 ENCOUNTER — Other Ambulatory Visit: Payer: Self-pay | Admitting: Adult Medicine

## 2021-07-12 ENCOUNTER — Ambulatory Visit
Admission: RE | Admit: 2021-07-12 | Discharge: 2021-07-12 | Disposition: A | Discharge status: Home / Self Care | Payer: Medicare (Managed Care) | Source: Ambulatory Visit | Attending: Adult Medicine | Admitting: Adult Medicine

## 2021-07-12 DIAGNOSIS — R928 Other abnormal and inconclusive findings on diagnostic imaging of breast: Secondary | ICD-10-CM

## 2021-07-12 DIAGNOSIS — R922 Inconclusive mammogram: Secondary | ICD-10-CM | POA: Diagnosis not present

## 2021-07-12 DIAGNOSIS — N6489 Other specified disorders of breast: Secondary | ICD-10-CM

## 2021-12-29 DIAGNOSIS — Z0000 Encounter for general adult medical examination without abnormal findings: Secondary | ICD-10-CM | POA: Diagnosis not present

## 2022-01-16 ENCOUNTER — Ambulatory Visit
Admission: RE | Admit: 2022-01-16 | Discharge: 2022-01-16 | Disposition: A | Discharge status: Home / Self Care | Payer: Medicare (Managed Care) | Source: Ambulatory Visit | Attending: Adult Medicine | Admitting: Adult Medicine

## 2022-01-16 DIAGNOSIS — R928 Other abnormal and inconclusive findings on diagnostic imaging of breast: Secondary | ICD-10-CM | POA: Diagnosis not present

## 2022-01-16 DIAGNOSIS — N6489 Other specified disorders of breast: Secondary | ICD-10-CM

## 2022-01-25 DIAGNOSIS — Z113 Encounter for screening for infections with a predominantly sexual mode of transmission: Secondary | ICD-10-CM | POA: Diagnosis not present

## 2022-01-25 DIAGNOSIS — N760 Acute vaginitis: Secondary | ICD-10-CM | POA: Diagnosis not present

## 2022-01-25 DIAGNOSIS — Z114 Encounter for screening for human immunodeficiency virus [HIV]: Secondary | ICD-10-CM | POA: Diagnosis not present

## 2022-02-14 DIAGNOSIS — Z131 Encounter for screening for diabetes mellitus: Secondary | ICD-10-CM | POA: Diagnosis not present

## 2022-02-14 DIAGNOSIS — Z0130 Encounter for examination of blood pressure without abnormal findings: Secondary | ICD-10-CM | POA: Diagnosis not present

## 2022-02-14 DIAGNOSIS — E041 Nontoxic single thyroid nodule: Secondary | ICD-10-CM | POA: Diagnosis not present

## 2022-02-14 DIAGNOSIS — E213 Hyperparathyroidism, unspecified: Secondary | ICD-10-CM | POA: Diagnosis not present

## 2022-02-14 DIAGNOSIS — R928 Other abnormal and inconclusive findings on diagnostic imaging of breast: Secondary | ICD-10-CM | POA: Diagnosis not present

## 2022-02-14 DIAGNOSIS — N183 Chronic kidney disease, stage 3 unspecified: Secondary | ICD-10-CM | POA: Diagnosis not present

## 2022-02-14 DIAGNOSIS — Z789 Other specified health status: Secondary | ICD-10-CM | POA: Diagnosis not present

## 2022-02-14 DIAGNOSIS — E785 Hyperlipidemia, unspecified: Secondary | ICD-10-CM | POA: Diagnosis not present

## 2022-02-14 DIAGNOSIS — Z6832 Body mass index (BMI) 32.0-32.9, adult: Secondary | ICD-10-CM | POA: Diagnosis not present

## 2022-02-14 DIAGNOSIS — Z760 Encounter for issue of repeat prescription: Secondary | ICD-10-CM | POA: Diagnosis not present

## 2022-02-14 DIAGNOSIS — I10 Essential (primary) hypertension: Secondary | ICD-10-CM | POA: Diagnosis not present

## 2022-02-14 DIAGNOSIS — R030 Elevated blood-pressure reading, without diagnosis of hypertension: Secondary | ICD-10-CM | POA: Diagnosis not present

## 2022-03-16 DIAGNOSIS — N183 Chronic kidney disease, stage 3 unspecified: Secondary | ICD-10-CM | POA: Diagnosis not present

## 2022-10-28 LAB — EXTERNAL GENERIC LAB PROCEDURE

## 2022-12-25 LAB — EXTERNAL GENERIC LAB PROCEDURE: COLOGUARD: NEGATIVE

## 2023-07-01 IMAGING — MG MM DIGITAL DIAGNOSTIC UNILAT*R* W/ TOMO W/ CAD
4 series · 4 of 12 positions shown · non-contrast
Comparison: Previous exam(s).

CLINICAL DATA: 65-year-old female presenting for first six-month
follow-up of a probably benign right breast asymmetry without
ultrasound correlate.

EXAM:
DIGITAL DIAGNOSTIC UNILATERAL RIGHT MAMMOGRAM WITH TOMOSYNTHESIS AND
CAD
TECHNIQUE: Right digital diagnostic mammography and breast tomosynthesis was
performed. The images were evaluated with computer-aided detection.

[R MLO synth-2D]
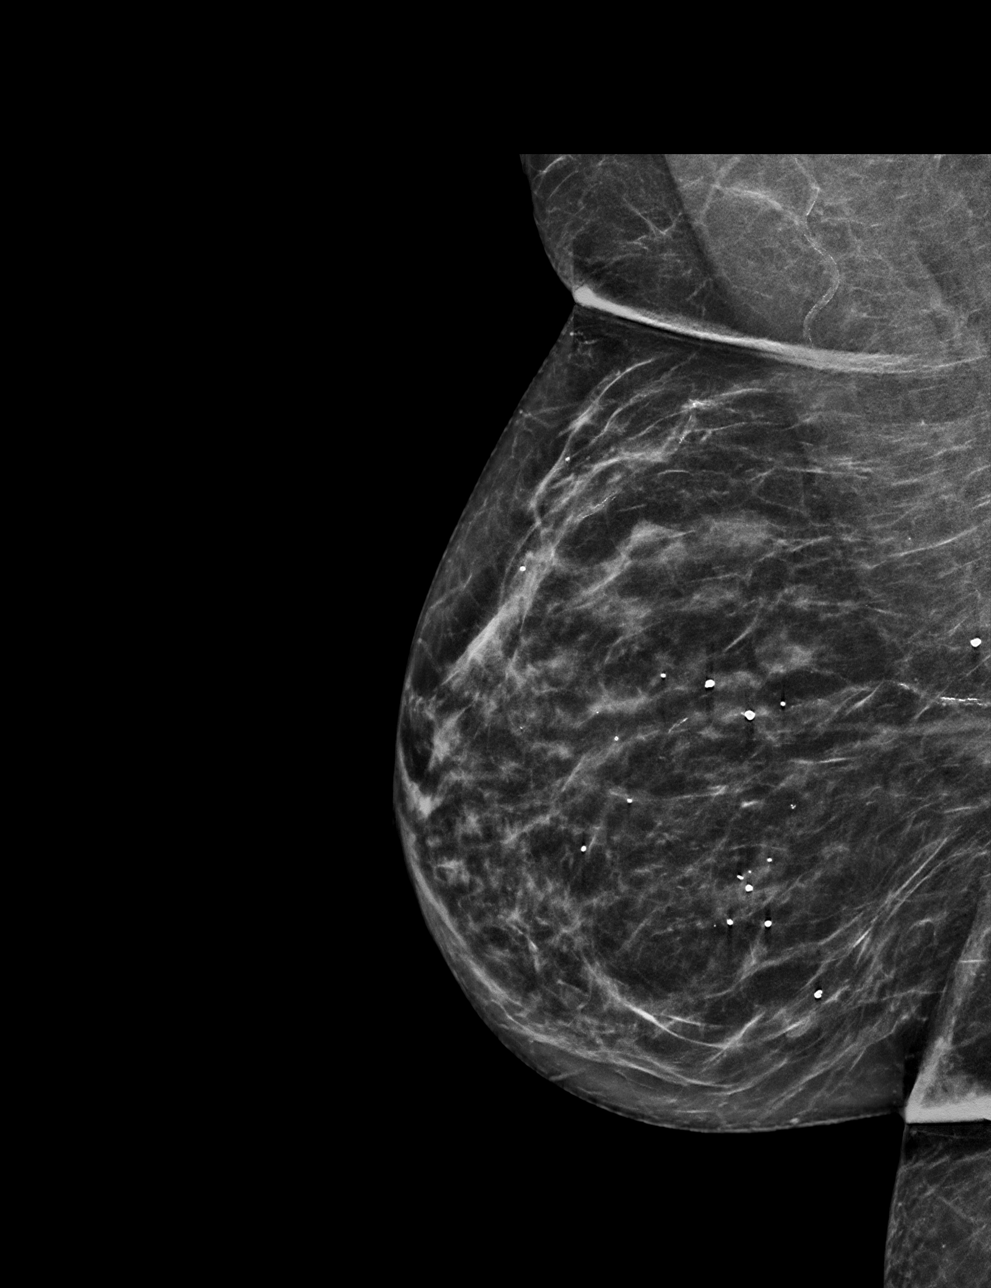

[R CC synth-2D]
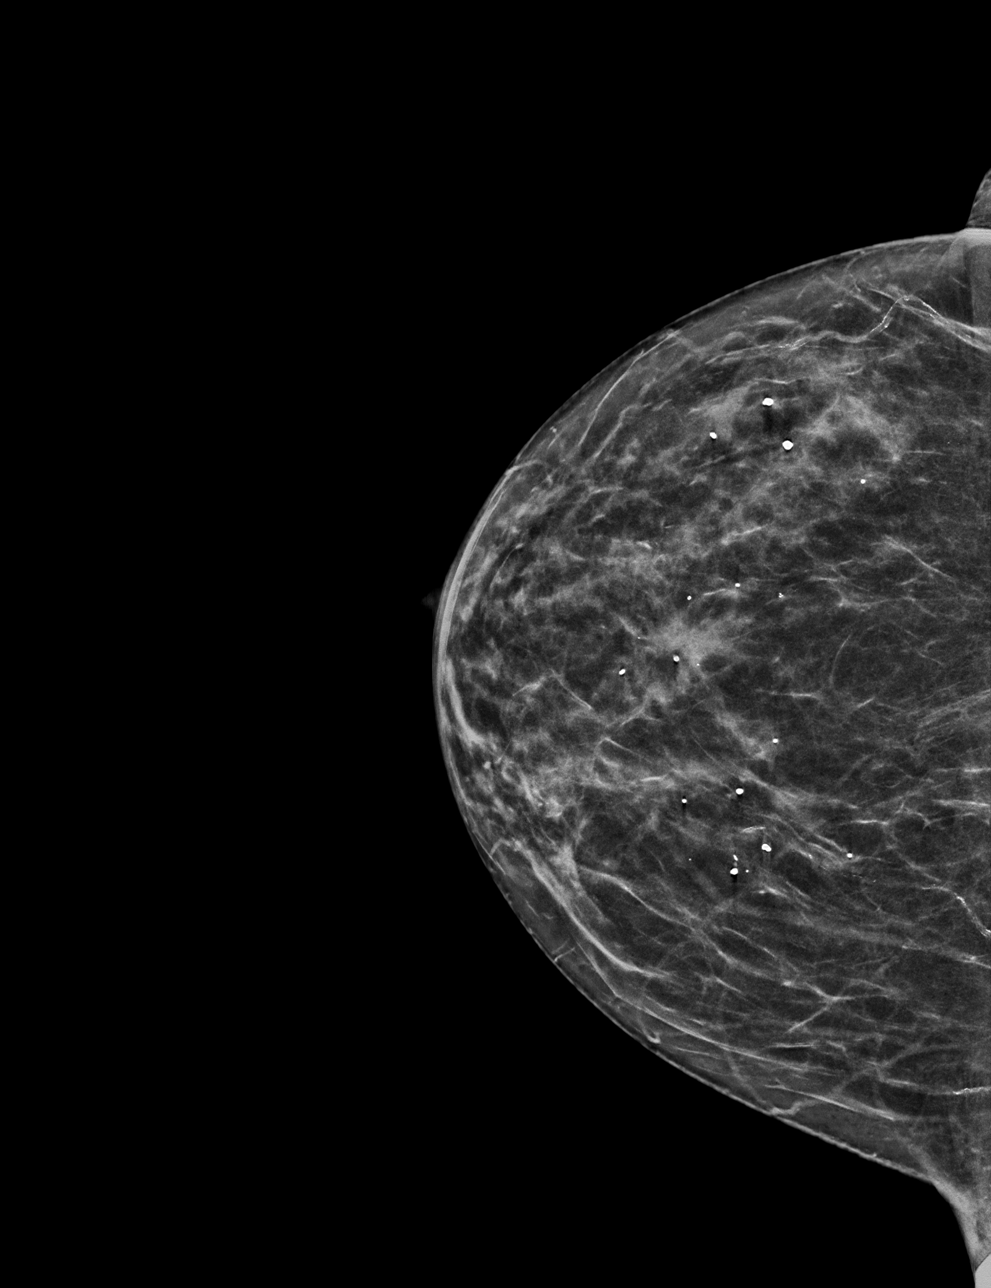

[R CC tomo · tomo slice 26/51.0]
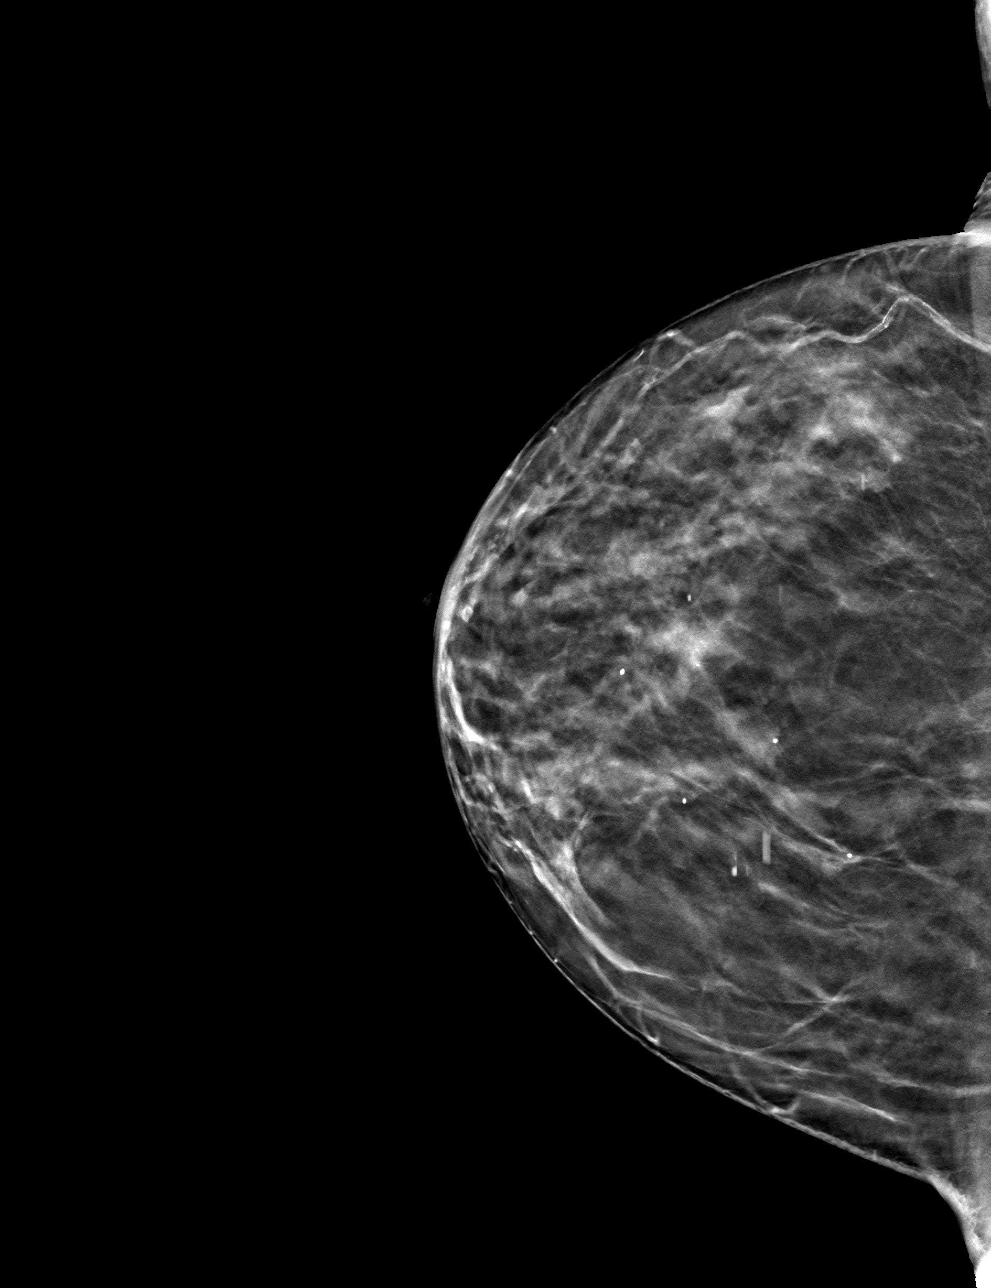

[R MLO tomo · tomo slice 29/56.0]
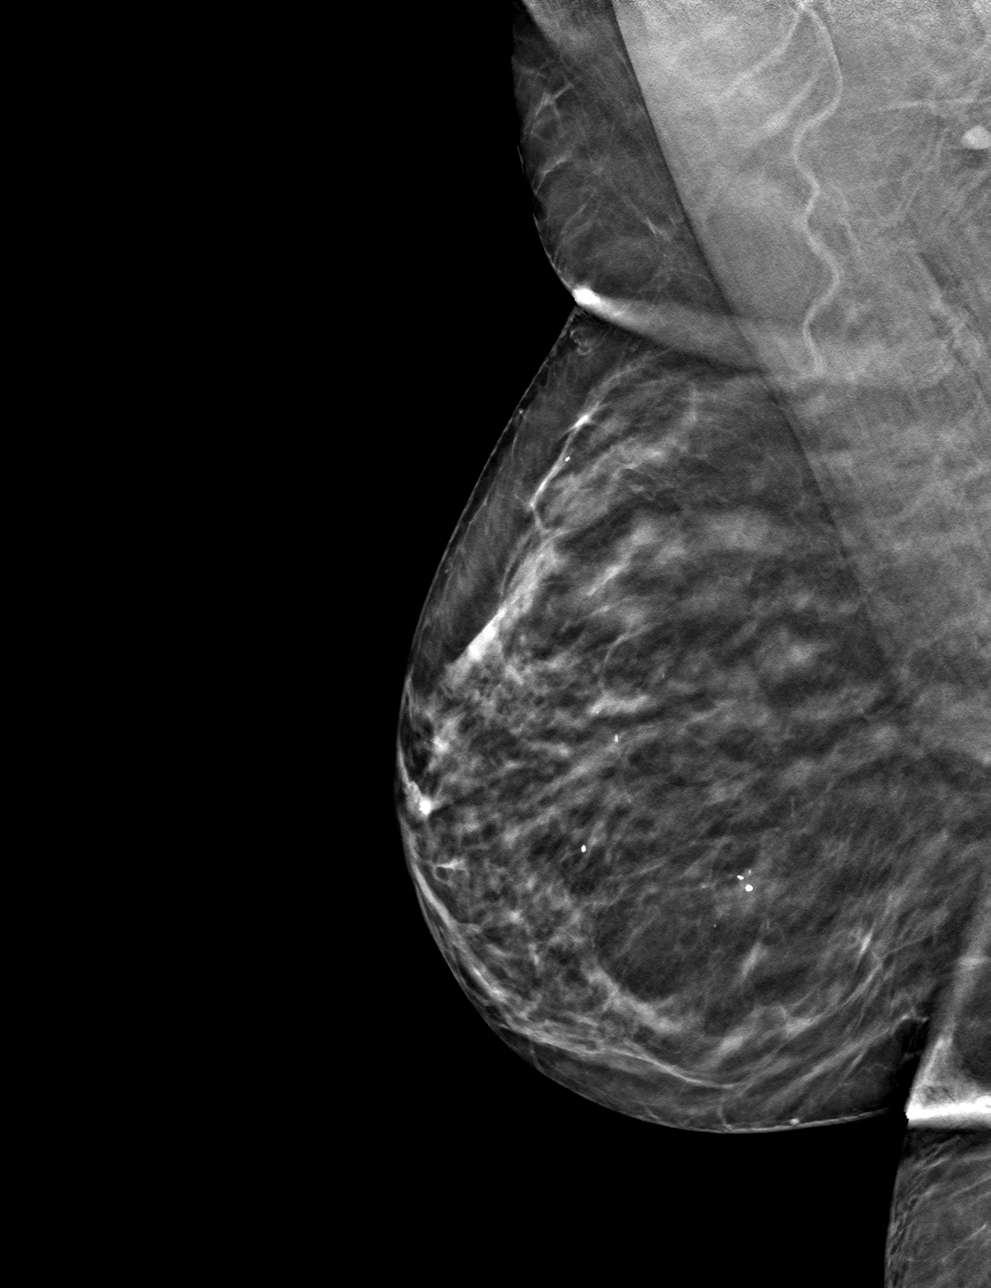

[4 of 12 positions shown; findings below may reference images not displayed]

ACR Breast Density Category c: The breast tissue is heterogeneously
dense, which may obscure small masses.
FINDINGS: An asymmetry in the slightly lateral right breast at posterior depth
is less conspicuous on today's views. It is best seen on the cc
projection. Otherwise, no new or suspicious findings in the
remainder of the right breast.
IMPRESSION: Stable, probably benign right breast asymmetry. Recommend continued
short-term follow-up.

RECOMMENDATION:
Bilateral diagnostic mammogram in 6 months.

I have discussed the findings and recommendations with the patient.
If applicable, a reminder letter will be sent to the patient
regarding the next appointment.

BI-RADS CATEGORY  3: Probably benign.
